# Patient Record
Sex: Male | Born: 1975 | Race: Black or African American | Hispanic: No | Marital: Single | Smoking: Current every day smoker
Health system: Southern US, Community
[De-identification: ages and names within clinical notes are randomized; demographics above are authoritative.]

## PROBLEM LIST (undated history)

## (undated) HISTORY — PX: FRACTURE SURGERY: SHX138

---

## 2010-02-05 ENCOUNTER — Emergency Department: Payer: Self-pay | Admitting: Emergency Medicine

## 2011-04-10 LAB — CBC
HGB: 13.4 g/dL (ref 13.0–18.0)
MCH: 31.5 pg (ref 26.0–34.0)
MCHC: 33.2 g/dL (ref 32.0–36.0)
MCV: 95 fL (ref 80–100)
Platelet: 248 10*3/uL (ref 150–440)
RBC: 4.26 10*6/uL — ABNORMAL LOW (ref 4.40–5.90)
WBC: 19.4 10*3/uL — ABNORMAL HIGH (ref 3.8–10.6)

## 2011-04-10 LAB — COMPREHENSIVE METABOLIC PANEL
Albumin: 3.7 g/dL (ref 3.4–5.0)
Alkaline Phosphatase: 63 U/L (ref 50–136)
Anion Gap: 8 (ref 7–16)
Bilirubin,Total: 0.7 mg/dL (ref 0.2–1.0)
Calcium, Total: 9.3 mg/dL (ref 8.5–10.1)
Chloride: 101 mmol/L (ref 98–107)
Co2: 28 mmol/L (ref 21–32)
EGFR (African American): >60
EGFR (Non-African Amer.): >60
Osmolality: 274 (ref 275–301)
SGOT(AST): 14 U/L — ABNORMAL LOW (ref 15–37)
SGPT (ALT): 15 U/L
Total Protein: 8.2 g/dL (ref 6.4–8.2)

## 2011-04-10 LAB — DIFFERENTIAL
Basophil %: 0.2 %
Eosinophil %: 0.7 %
Lymphocyte #: 2.6 10*3/uL (ref 1.0–3.6)
Monocyte #: 1.2 10*3/uL — ABNORMAL HIGH (ref 0.0–0.7)
Neutrophil #: 15.4 10*3/uL — ABNORMAL HIGH (ref 1.4–6.5)

## 2011-04-11 ENCOUNTER — Inpatient Hospital Stay: Payer: Self-pay | Admitting: Internal Medicine

## 2011-04-12 LAB — CBC WITH DIFFERENTIAL/PLATELET
Basophil #: 0.1 10*3/uL (ref 0.0–0.1)
Basophil %: 0.7 %
Eosinophil #: 0.2 10*3/uL (ref 0.0–0.7)
HCT: 37.4 % — ABNORMAL LOW (ref 40.0–52.0)
HGB: 12.4 g/dL — ABNORMAL LOW (ref 13.0–18.0)
Lymphocyte #: 2.7 10*3/uL (ref 1.0–3.6)
MCH: 31.5 pg (ref 26.0–34.0)
MCV: 95 fL (ref 80–100)
Monocyte #: 0.9 10*3/uL — ABNORMAL HIGH (ref 0.0–0.7)
Monocyte %: 9.1 %
Neutrophil #: 6.4 10*3/uL (ref 1.4–6.5)
RBC: 3.92 10*6/uL — ABNORMAL LOW (ref 4.40–5.90)
RDW: 12.6 % (ref 11.5–14.5)
WBC: 10.3 10*3/uL (ref 3.8–10.6)

## 2011-04-16 LAB — CULTURE, BLOOD (SINGLE)

## 2014-04-11 ENCOUNTER — Emergency Department: Payer: Self-pay | Admitting: Emergency Medicine

## 2014-05-30 NOTE — Consult Note (Signed)
39 year old with infection L knee area. reviewed and patient examined. area of open draining area medial knee, may communicate with prepatellar bursa, but not knee joint. No effusion knee joint itself, range of motion of knee non tender. believe that this is a largely superficial infection. Orders written for wound management/dressing changes, would continue with IV antibiotics as prescribed.  Electronic Signatures: Clare GandySmith, Orvil Faraone E (MD)  (Signed on 06-Mar-13 16:51)  Authored  Last Updated: 06-Mar-13 16:51 by Clare GandySmith, Kristyl Athens E (MD)

## 2014-05-30 NOTE — Discharge Summary (Signed)
PATIENT NAME:  Logan Robinson, Logan Robinson DATE OF BIRTH:  01-20-1976  DATE OF ADMISSION:  04/11/2011 DATE OF DISCHARGE:  04/12/2011  PRIMARY CARE PHYSICIAN: None.   PRESENTING COMPLAINT: Left knee cellulitis/pain.   DISCHARGE DIAGNOSES:  1. Left knee cellulitis/abscess.  2. Leukocytosis.   CONDITION ON DISCHARGE: Fair.   MEDICATIONS:  1. Bactrim DS 1 tablet p.o. twice a day for 10 days.  2. Tylenol 500 mg p.o. three times daily p.r.n.  3. Dressing changes per instruction.  DISCHARGE INSTRUCTIONS/FOLLOWUP: The patient is recommended to follow up in the ER or acute urgent care if needed.   LABS/STUDIES: Wound culture shows moderate staph aureus, sensitivities are follow pending.   Blood cultures are negative in 18 to 24 hours.   White cell count is 10.3. Comprehensive metabolic panel within normal limits. Hemoglobin and hematocrit 13.4 and 40.4.   CONSULTANT: Myra Rudehristopher Smith, MD - Orthopedics.  BRIEF SUMMARY OF HOSPITAL COURSE: Mr. Logan Robinson is a 39 year old African American gentleman who was brought in from the jail with complaints of ongoing left knee pain, swelling, and purulent discharge. The patient was admitted with left knee cellulitis/abscess. He had an open lesion on the left knee medially. He was initially on Keflex. That was changed to clindamycin; however, his symptoms got worse. Hence he was brought to the Emergency Room and was admitted and started on IV vancomycin. The patient's redness improved remarkably. He was also seen by Dr. Katrinka BlazingSmith who did not see any indication for incision and drainage. The patient's would culture grew Staphylococcus, lab sensitivities are pending. His antibiotics were changed to p.o. Bactrim for 10 days. His leukocytosis resolved. The patient was ambulating well in the room. Dressing supplies were given to him. He will be discharged to home and follow-up in the ER or urgent care as needed.   TIME SPENT: 40 minutes.   ____________________________ Logan HailSona A. Allena KatzPatel, MD sap:slb D: 04/12/2011 15:02:46 ET T: 04/12/2011 17:14:32 ET JOB#: [id number]  cc: Logan Morss A. Allena KatzPatel, MD, <Dictator> Logan OraSONA A Raghad Lorenz MD ELECTRONICALLY SIGNED 04/19/2011 16:04
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
## 2019-09-28 ENCOUNTER — Ambulatory Visit: Payer: Self-pay | Attending: Internal Medicine

## 2019-09-28 DIAGNOSIS — Z23 Encounter for immunization: Secondary | ICD-10-CM

## 2019-09-28 NOTE — Progress Notes (Signed)
   Covid-19 Vaccination Clinic  Name:  Logan Robinson    MRN: [id number] DOB: 06-10-1975  09/28/2019  Mr. Logan Robinson was observed post Covid-19 immunization for 15 minutes without incident. He was provided with Vaccine Information Sheet and instruction to access the V-Safe system.   Mr. Logan Robinson was instructed to call 911 with any severe reactions post vaccine: Marland Kitchen Difficulty breathing  . Swelling of face and throat  . A fast heartbeat  . A bad rash all over body  . Dizziness and weakness   Immunizations Administered    Name Date Dose VIS Date Route   Pfizer COVID-19 Vaccine 09/28/2019  2:00 PM 0.3 mL 04/01/2018 Intramuscular   Manufacturer: [employer], [employer]   Lot: [id number]   NDC: [address]-9

## 2019-10-19 ENCOUNTER — Ambulatory Visit: Payer: Self-pay

## 2021-12-26 NOTE — Congregational Nurse Program (Signed)
  Dept: [phone]   Congregational Nurse Program Note  Date of Encounter: 12/26/2021 Client to [facility] day center for assistance with new medical insurance card. RN was able to find a PCP for client, but his insurance does not become effective until 02/05/22. [facility] application was completed during visit and apt was able to be made for client on 11/28 at 3 pm. Application dropped off by Rn today. BP very high, client reports no previous history of BP, but has not had any regular medical care. RN gave education regarding symptoms to be alert for. Client voiced understanding. RN was able to make an apt for client at a new PCP after his insurance goes into effect. Appt scheduled at [facility] family medicine in [place] for 02/08/22. Client will  be able to be seen at the [facility] until then. Client very appreciative of assistance provided Past Medical History: No past medical history on file.  Encounter Details:  CNP Questionnaire - 12/26/21 1828       Questionnaire   Ask client: Do you give verbal consent for me to treat you today? Yes    Student Assistance N/A    Location Patient Served  [facility]    Visit Setting with Client Organization    Patient Status Unknown   client does have an address   Insurance Uninsured ([place] [employer]/Care Connects/Self-Pay/Medicaid Family Planning)   [place] will have BC/BS [facility] care effective 02/05/22   Insurance/Financial Assistance Referral N/A    Medication N/A   not currenlty on any medications   Medical Provider No   [facility] application completed at visit today   Screening Referrals Made N/A    Medical Referrals Made Non-[facility] PCP/Clinic   [facility] application completed and dropped off 11/21   Medical Appointment Made Non-[facility] PCP/clinic   [facility] apt 11/28 at 3;00 pm   Recently w/o PCP, now 1st time PCP visit completed due to CNs referral or appointment made N/A   1 st time apt on 11/28 at [facility]   Food N/A   has food stamps   Transportation N/A    uses the bus or has family that provides transportation   Housing/Utilities N/A    Interpersonal Safety N/A    Interventions Advocate/Support;Navigate Healthcare System;Educate;Case Management    Abnormal to Normal Screening Since Last CN Visit N/A    Screenings CN Performed Blood Pressure    Sent Client to Lab for: N/A    Did client attend any of the following based off CNs referral or appointments made? N/A   1 st apt 11/28   ED Visit Averted N/A   BP very high, client denied any sx. Educated to need to go to ED if any sx   Life-Saving Intervention Made N/A

## 2022-01-02 ENCOUNTER — Ambulatory Visit: Payer: Self-pay | Admitting: Gerontology

## 2022-01-02 ENCOUNTER — Other Ambulatory Visit: Payer: Self-pay

## 2022-01-02 ENCOUNTER — Encounter: Payer: Self-pay | Admitting: Gerontology

## 2022-01-02 VITALS — BP 195/119 | HR 72 | Wt 176.6 lb

## 2022-01-02 DIAGNOSIS — Z7689 Persons encountering health services in other specified circumstances: Secondary | ICD-10-CM | POA: Insufficient documentation

## 2022-01-02 DIAGNOSIS — R0602 Shortness of breath: Secondary | ICD-10-CM | POA: Insufficient documentation

## 2022-01-02 DIAGNOSIS — F17200 Nicotine dependence, unspecified, uncomplicated: Secondary | ICD-10-CM | POA: Insufficient documentation

## 2022-01-02 DIAGNOSIS — I10 Essential (primary) hypertension: Secondary | ICD-10-CM | POA: Insufficient documentation

## 2022-01-02 MED ORDER — LOSARTAN POTASSIUM 50 MG PO TABS
50.0000 mg | ORAL_TABLET | Freq: Every day | ORAL | 0 refills | Status: DC
  Filled 2022-01-02: qty 30, 30d supply, fill #0

## 2022-01-02 MED ORDER — BLOOD PRESSURE KIT
1.0000 | PACK | Freq: Every day | 0 refills | Status: AC
  Filled 2022-01-02: qty 1, fill #0

## 2022-01-02 MED ORDER — ALBUTEROL SULFATE HFA 108 (90 BASE) MCG/ACT IN AERS
1.0000 | INHALATION_SPRAY | Freq: Four times a day (QID) | RESPIRATORY_TRACT | 0 refills | Status: DC | PRN
  Filled 2022-01-02: qty 18, 25d supply, fill #0
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
## 2022-01-02 NOTE — Progress Notes (Deleted)
   New Patient Office Visit  Subjective    Patient ID: Riaan Toledo, male    DOB: 13-Apr-1975  Age: 46 y.o. MRN: [id number]  CC:  Chief Complaint  Patient presents with   Establish Care    Patient concerned that he may have HTN    HPI Scotty Weigelt is a 46 y/o male who has history of hypertension, presents to establish care   No outpatient encounter medications on file as of 01/02/2022.   No facility-administered encounter medications on file as of 01/02/2022.    No past medical history on file.  *** The histories are not reviewed yet. Please review them in the "History" navigator section and refresh this SmartLink.  No family history on file.  Social History   Socioeconomic History   Marital status: Single    Spouse name: Not on file   Number of children: Not on file   Years of education: Not on file   Highest education level: Not on file  Occupational History   Not on file  Tobacco Use   Smoking status: Not on file   Smokeless tobacco: Not on file  Substance and Sexual Activity   Alcohol use: Not on file   Drug use: Not on file   Sexual activity: Not on file  Other Topics Concern   Not on file  Social History Narrative   Not on file   Social Determinants of Health   Financial Resource Strain: Not on file  Food Insecurity: Not on file  Transportation Needs: Not on file  Physical Activity: Not on file  Stress: Not on file  Social Connections: Not on file  Intimate Partner Violence: Not on file    ROS      Objective    There were no vitals taken for this visit.  Physical Exam  {Labs (Optional):23779}    Assessment & Plan:   Problem List Items Addressed This Visit   None   No follow-ups on file.   Meril Dray Trellis Paganini, NP

## 2022-01-02 NOTE — Patient Instructions (Signed)

## 2022-01-03 NOTE — Congregational Nurse Program (Signed)
  Dept: [phone]   Congregational Nurse Program Note  Date of Encounter: 01/03/2022 [facility]. He was prescribed medication for his BP and albuterol for COPD. He plans to pick up his medications today. RN provided education regarding his new medications and also instructed his to speak with the pharmacist at the [facility] community pharmacy when he picked up his medications today/. He has a follow up apt at the [facility] next week, Dec. 6 for blood pressure follow up. Client very appreciative of RN assistance. Past Medical History: No past medical history on file.  Encounter Details:  CNP Questionnaire - 01/03/22 1138       Questionnaire   Ask client: Do you give verbal consent for me to treat you today? Yes    Student Assistance N/A    Location Patient Served  [facility]    Visit Setting with Client Organization    Patient Status Unknown   client does have an address   Insurance Uninsured ([employer]s/Self-Pay/Medicaid Family Planning)   [place] will have BC/BS [facility] care effective 02/05/22   Insurance/Financial Assistance Referral N/A    Medication N/A   not currenlty on any medications   Medical Provider No   [facility] application completed at visit today   Screening Referrals Made N/A    Medical Referrals Made [facility] P[facility]inic   [facility] application completed and dropped off 11/21   Medical Appointment Made Non-[facility] PCP[facility]clinic   [facility] 11/28 at 3;00 pm   Recently w/o PCP, now 1st time PCP visit completed due to CNs referral or appointment made Yes   Client did attend his [facility] last week   Food N/A   has food stamps   Transportation N/A   uses the [employer] bus or friends/family provide transportation   Housing/Utilities N/A    Interpersonal Safety N/A    Interventions Advocate/Support;Navigate Healthcare System;Educate;Case Management;Reviewed Medications    Abnormal to Normal Screening  Since Last CN Visit N/A    Screenings CN Performed Blood Pressure;Pulse Ox    Sent Client to Lab for: N/A    Did client attend any of the following based off CNs referral or appointments made? Medication Assistance;Medical   1 st apt 11/28. Client did attend his first [facility] apt and was prescribed medication for BP   ED Visit Averted N/A   BP very high, client denied any sx. Educated to need to go to ED if any sx   Life-Saving Intervention Made N/A

## 2022-01-10 ENCOUNTER — Ambulatory Visit: Payer: BLUE CROSS/BLUE SHIELD | Admitting: Gerontology

## 2022-01-10 ENCOUNTER — Other Ambulatory Visit: Payer: Self-pay

## 2022-02-14 NOTE — Congregational Nurse Program (Signed)
  Dept: [phone]   Congregational Nurse Program Note  Date of Encounter: 02/14/2022 Client to [facility] day Center for blood pressure check. He reports he did attend his new PCP at on 02/08/22 at [facility]. No medication changes. BP elevated at visit today. Client reports he has taken his BP medication today and is taking it regularly. Continued diet education provided. Client reports eating ":all the stuff I shouldn't". He does not know if he has a follow up apt. Rn to assist with this. RN requested client return to the center tomorrow for another blood pressure check. He voiced understanding and agreement. Past Medical History: No past medical history on file.  Encounter Details:  CNP Questionnaire - 02/14/22 0902       Questionnaire   Ask client: Do you give verbal consent for me to treat you today? Yes    Student Assistance N/A    Location Patient [place]    Visit Setting with Client Organization    Patient Status Unknown   client does have an address   Insurance Medicaid   BC/BS medicaid was effective 02/05/2022   Insurance/Financial Assistance Referral N/A    Medication N/A   not currenlty on any medications   Medical Provider Yes   client now a patient at [facility] in [place]. !st apt was 02/08/22   Screening Referrals Made N/A    Medical Referrals Made N/A    Medical Appointment Made N/A    Recently w/o PCP, now 1st time PCP visit completed due to CNs referral or appointment made Yes   clien now has [place] and has a new PCP, [facility] in [place]   has food stamps   Transportation N/A   uses the [employer] bus or friends/family provide transportation   Housing/Utilities N/A    Interpersonal Safety N/A    Interventions Advocate/Support;Educate;Reviewed Medications    Abnormal to Normal Screening Since Last CN Visit N/A    Screenings CN Performed Blood Pressure;Pulse Ox    Sent Client to Lab for: N/A    Did client attend any of the following based off CNs referral or  appointments made? Medical   client had his first apt at [place] his new PCP on 02/08/22   ED Visit Averted N/A   BP very high, client denied any sx. Educated to need to go to ED if any sx   Life-Saving Intervention Made N/A

## 2022-02-21 NOTE — Congregational Nurse Program (Signed)
  Dept: [phone]   Congregational Nurse Program Note  Date of Encounter: 02/21/2022 Client to [facility] day center for blood pressure check. Client has reported that he had gone to his first new patient apt at [facility] on 1/4, but he admitted that did not go. He reports taking his Norvasc 50 mg daily as directed. His blood pressure remains very elevated. He denied any headaches or dizziness. Rn was able to make an apt at [facility] for tomorrow 1/18 at 3 pm. [employer] bus pass given. Client instructed to bring his medication bottle with him to the appointment. Education given regarding s/s of stroke and importance of attending this appointment. Client voiced understanding. Past Medical History: No past medical history on file.  Encounter Details:  CNP Questionnaire - 02/21/22 0955       Questionnaire   Ask client: Do you give verbal consent for me to treat you today? Yes    Student Assistance N/A    Location Patient [place]    Visit Setting with Client Organization    Patient Status Unknown   client does have an address   Insurance Medicaid   BC/BS medicaid was effective 02/05/2022   Insurance/Financial Assistance Referral N/A    Medication N/A   not currenlty on any medications   Medical Provider Yes   client did not attend his apt on 1/4 at [place] Appointment Made [facility] P[facility]clinic   apt made aor 1/18 at 3;00 pm   Recently w/o PCP, now 1st time PCP visit completed due to CNs referral or appointment made N/A   clien now has [place] and has a new PCP, [facility] in [place]   has food stamps   Transportation Provided transportation assistance   client given bus passes for [facility] apt tomorrow 1/18   Housing/Utilities N/A    Interpersonal Safety N/A    Interventions Advocate/Support;Educate;Reviewed Medications;Navigate Healthcare System    Abnormal to Normal Screening Since Last CN Visit N/A    Screenings CN  Performed Blood Pressure;Pulse Ox    Sent Client to Lab for: N/A    Did client attend any of the following based off CNs referral or appointments made? Medical   client had his first apt at [place] his new PCP on 02/08/22   ED Visit Averted N/A   BP very high, client denied any sx. Educated to need to go to ED if any sx   Life-Saving Intervention Made N/A

## 2022-02-23 ENCOUNTER — Emergency Department: Payer: Medicaid Other

## 2022-02-23 ENCOUNTER — Inpatient Hospital Stay
Admission: EM | Admit: 2022-02-23 | Discharge: 2022-02-25 | DRG: 305 | Disposition: A | Discharge status: Home / Self Care | Payer: Medicaid Other | Source: Ambulatory Visit | Attending: Hospitalist | Admitting: Hospitalist

## 2022-02-23 ENCOUNTER — Encounter (HOSPITAL_COMMUNITY): Payer: Self-pay

## 2022-02-23 ENCOUNTER — Other Ambulatory Visit: Payer: Self-pay

## 2022-02-23 DIAGNOSIS — I160 Hypertensive urgency: Secondary | ICD-10-CM | POA: Diagnosis present

## 2022-02-23 DIAGNOSIS — R519 Headache, unspecified: Secondary | ICD-10-CM | POA: Insufficient documentation

## 2022-02-23 DIAGNOSIS — I6521 Occlusion and stenosis of right carotid artery: Secondary | ICD-10-CM | POA: Diagnosis present

## 2022-02-23 DIAGNOSIS — I161 Hypertensive emergency: Principal | ICD-10-CM | POA: Diagnosis present

## 2022-02-23 DIAGNOSIS — T465X6A Underdosing of other antihypertensive drugs, initial encounter: Secondary | ICD-10-CM | POA: Diagnosis present

## 2022-02-23 DIAGNOSIS — I10 Essential (primary) hypertension: Secondary | ICD-10-CM | POA: Diagnosis present

## 2022-02-23 DIAGNOSIS — Z79899 Other long term (current) drug therapy: Secondary | ICD-10-CM

## 2022-02-23 DIAGNOSIS — I671 Cerebral aneurysm, nonruptured: Secondary | ICD-10-CM | POA: Insufficient documentation

## 2022-02-23 DIAGNOSIS — F17210 Nicotine dependence, cigarettes, uncomplicated: Secondary | ICD-10-CM | POA: Diagnosis present

## 2022-02-23 DIAGNOSIS — Z1152 Encounter for screening for COVID-19: Secondary | ICD-10-CM

## 2022-02-23 DIAGNOSIS — Z91138 Patient's unintentional underdosing of medication regimen for other reason: Secondary | ICD-10-CM

## 2022-02-23 DIAGNOSIS — Z8249 Family history of ischemic heart disease and other diseases of the circulatory system: Secondary | ICD-10-CM

## 2022-02-23 DIAGNOSIS — I729 Aneurysm of unspecified site: Secondary | ICD-10-CM

## 2022-02-23 DIAGNOSIS — F17200 Nicotine dependence, unspecified, uncomplicated: Secondary | ICD-10-CM | POA: Diagnosis present

## 2022-02-23 LAB — BASIC METABOLIC PANEL
Anion gap: 9 (ref 5–15)
BUN: 19 mg/dL (ref 6–20)
CO2: 22 mmol/L (ref 22–32)
Calcium: 8.8 mg/dL — ABNORMAL LOW (ref 8.9–10.3)
Chloride: 104 mmol/L (ref 98–111)
Creatinine, Ser: 0.91 mg/dL (ref 0.61–1.24)
GFR, Estimated: >60 mL/min (ref >60)
Glucose, Bld: 87 mg/dL (ref 70–99)
Potassium: 3.6 mmol/L (ref 3.5–5.1)
Sodium: 135 mmol/L (ref 135–145)

## 2022-02-23 LAB — TROPONIN I (HIGH SENSITIVITY)
Troponin I (High Sensitivity): 9 ng/L (ref <18)
Troponin I (High Sensitivity): 11 ng/L (ref <18)

## 2022-02-23 LAB — RESP PANEL BY RT-PCR (RSV, FLU A&B, COVID)  RVPGX2
Influenza A by PCR: NEGATIVE
Influenza B by PCR: NEGATIVE
Resp Syncytial Virus by PCR: NEGATIVE
SARS Coronavirus 2 by RT PCR: NEGATIVE

## 2022-02-23 LAB — CBC
HCT: 43 % (ref 39.0–52.0)
Hemoglobin: 14.4 g/dL (ref 13.0–17.0)
MCH: 32.7 pg (ref 26.0–34.0)
MCHC: 33.5 g/dL (ref 30.0–36.0)
MCV: 97.5 fL (ref 80.0–100.0)
Platelets: 278 10*3/uL (ref 150–400)
RBC: 4.41 MIL/uL (ref 4.22–5.81)
RDW: 12.2 % (ref 11.5–15.5)
WBC: 13.1 10*3/uL — ABNORMAL HIGH (ref 4.0–10.5)
nRBC: 0 % (ref 0.0–0.2)

## 2022-02-23 LAB — URINALYSIS, ROUTINE W REFLEX MICROSCOPIC
Bilirubin Urine: NEGATIVE
Glucose, UA: NEGATIVE mg/dL
Hgb urine dipstick: NEGATIVE
Ketones, ur: NEGATIVE mg/dL
Leukocytes,Ua: NEGATIVE
Nitrite: NEGATIVE
Protein, ur: NEGATIVE mg/dL
Specific Gravity, Urine: 1.02 (ref 1.005–1.030)
pH: 5 (ref 5.0–8.0)

## 2022-02-23 LAB — HEPATIC FUNCTION PANEL
ALT: 19 U/L (ref 0–44)
AST: 21 U/L (ref 15–41)
Albumin: 3.8 g/dL (ref 3.5–5.0)
Alkaline Phosphatase: 49 U/L (ref 38–126)
Bilirubin, Direct: <0.1 mg/dL (ref 0.0–0.2)
Total Bilirubin: 0.7 mg/dL (ref 0.3–1.2)
Total Protein: 7.2 g/dL (ref 6.5–8.1)

## 2022-02-23 LAB — LIPASE, BLOOD: Lipase: 44 U/L (ref 11–51)

## 2022-02-23 MED ORDER — LABETALOL HCL 5 MG/ML IV SOLN
10.0000 mg | Freq: Once | INTRAVENOUS | Status: AC
  Administered 2022-02-23: 10 mg via INTRAVENOUS
  Filled 2022-02-23: qty 4

## 2022-02-23 MED ORDER — DIPHENHYDRAMINE HCL 50 MG/ML IJ SOLN
12.5000 mg | Freq: Once | INTRAMUSCULAR | Status: AC
  Administered 2022-02-23: 12.5 mg via INTRAVENOUS
  Filled 2022-02-23: qty 1

## 2022-02-23 MED ORDER — NICARDIPINE HCL IN NACL 20-0.86 MG/200ML-% IV SOLN
0.0000 mg/h | INTRAVENOUS | Status: DC
  Administered 2022-02-23: 5 mg/h via INTRAVENOUS
  Administered 2022-02-24: 11 mg/h via INTRAVENOUS
  Administered 2022-02-24: 1 mg/h via INTRAVENOUS
  Filled 2022-02-23 (×4): qty 200

## 2022-02-23 MED ORDER — METOCLOPRAMIDE HCL 5 MG/ML IJ SOLN
10.0000 mg | Freq: Once | INTRAMUSCULAR | Status: AC
  Administered 2022-02-23: 10 mg via INTRAVENOUS
  Filled 2022-02-23: qty 2

## 2022-02-23 MED ORDER — IOHEXOL 350 MG/ML SOLN
75.0000 mL | Freq: Once | INTRAVENOUS | Status: AC | PRN
  Administered 2022-02-23: 75 mL via INTRAVENOUS

## 2022-02-23 NOTE — ED Triage Notes (Addendum)
Pt to ED via POV from home. Pt c/o chest pain in the center of chest x2 wks that radiates to his back and down right arm. Pt does report nausea and vomiting. Pt also reports a headache x5 days. Pt was seen at PCP due to high BP concerns and due to CP they did an EKG yesterday that was concerning.

## 2022-02-23 NOTE — ED Notes (Signed)
Paged out [facility] for possible transfer pre Dr. Jari Pigg. Facesheet faxed, Zack to [employer].

## 2022-02-23 NOTE — ED Notes (Signed)
This RN called lab to see what delay in pt lab results are. Staff in lab advised they are short staffed and trying the best they can at this time. The MD also called down half an hour ago and got the same response.

## 2022-02-23 NOTE — ED Notes (Signed)
This RN called lab and informed them I just sent down more blood.

## 2022-02-23 NOTE — ED Notes (Signed)
Paged out [facility] for neuro surgery per Dr. Jari Pigg

## 2022-02-23 NOTE — ED Notes (Signed)
Rn to bedside.   Pt advised he has been without his BP meds 2-3 weeks. He has been having CP and headache since he was out of his meds.  Pt is currently CP free.

## 2022-02-23 NOTE — ED Notes (Signed)
This RN recollected labs and sent down again.

## 2022-02-23 NOTE — ED Provider Notes (Addendum)
[facility] Provider Note    Event Date/Time   First MD Initiated Contact with Patient 02/23/22 1823     (approximate)   History   Chest Pain   HPI  Logan Robinson is a 47 y.o. male who comes in with chest pain in the center of her chest for the past 2 weeks going down his right arm.  Patient reports has been diagnosed with hypertension.  He is supposed to be on losartan but has not been taking it.  He states that he ran out of it.  He states that he went to his primary care doctor yesterday as blood pressure was significantly elevated and they told him to go to the hospital but he declined transfer due to having commitments with his children.  He then decided to come to our emergency room.  He reports having a severe headache 8 out of 10 that has been going off and on for few weeks but does report that it worsened yesterday acutely.  He also reports having some chest discomfort over the past few weeks that is a sharp stabbing on the right side of his chest but states that he currently does not have any chest pain.  He reports no chest pain for over 24 hours.  He reports that the chest pain comes on he does get a little short of breath with it.  He denies any abdominal pain.   Physical Exam   Triage Vital Signs: ED Triage Vitals  Enc Vitals Group     BP --      Pulse --      Resp --      Temp --      Temp src --      SpO2 --      Weight 02/23/22 1818 170 lb (77.1 kg)     Height 02/23/22 1818 5\' 6"  (1.676 m)     Head Circumference --      Peak Flow --      Pain Score 02/23/22 1816 8     Pain Loc --      Pain Edu? --      Excl. in GC? --     Most recent vital signs: Vitals:   02/23/22 1830 02/23/22 1832  BP: (!) 198/147   Pulse: 88   Resp: 15   Temp:  98.1 F (36.7 C)  SpO2: 98%      General: Awake, no distress.  CV:  Good peripheral perfusion.  Resp:  Normal effort.  Abd:  No distention.  Soft and nontender Other:  Equal radial pulses.   Equal pedal pulses no numbness or tingling.   ED Results / Procedures / Treatments   Labs (all labs ordered are listed, but only abnormal results are displayed) Labs Reviewed  RESP PANEL BY RT-PCR (RSV, FLU A&B, COVID)  [id number]  BASIC METABOLIC PANEL  CBC  HEPATIC FUNCTION PANEL  LIPASE, BLOOD  TROPONIN I (HIGH SENSITIVITY)     EKG  My interpretation of EKG:  Normal sinus rate of 87 without any ST elevations but does have some T wave versions in 2 3 aVF with normal intervals  RADIOLOGY I have reviewed the xray personally and interpreted and no widened mediastinum   PROCEDURES:  Critical Care performed: Yes, see critical care procedure note(s)  Procedures   MEDICATIONS ORDERED IN ED: Medications  labetalol (NORMODYNE) injection 10 mg (has no administration in time range)  metoCLOPramide (REGLAN) injection 10 mg (has no  administration in time range)  diphenhydrAMINE (BENADRYL) injection 12.5 mg (has no administration in time range)     IMPRESSION / MDM / ASSESSMENT AND PLAN / ED COURSE  I reviewed the triage vital signs and the nursing notes.   Patient's presentation is most consistent with acute presentation with potential threat to life or bodily function.   Patient comes in with concerns for headaches and intermittent chest pain.  Patient was immediately brought back to room due to concerns with EKG with ST depressions in the inferior leads.  However upon patient coming back to the room he denies any chest pain for over 24 hours.  Do not feel this is equivalent to a STEMI.  His chief complaint is headaches.  This concerning for intercranial hemorrhage, aneurysm.  Will get CTA to further evaluate.  Will treat patient's blood pressure with labetalol and give migraine cocktail.  Labs ordered evaluate for any AKI.   Patient has continue deny any chest pain.  Did show the EKGs to Dr. Daniel Nones who thought that they were more likely just related to hypertension given  negative troponins did not feel like it was MI  7:55 PM discussed with the lab and they are stating that it will be another 30 minutes.  Explained that or even an hour and a half since labs to come back and will try to understand what the delay was and he stated that he was the only one there.   8:56 PM nurse was also called and still no labs have resulted for patient who is now been here for almost 3 hours.  I will order a CT head given my concern of this continued delay of care and the patient had an intracranial hemorrhage acutely started were aggressive treatment.  Patient does seem more comfortable resting in bed.   9:42 PM reevaluated patient and he denies any chest pain or headaches at this time.  Labs are starting to come back with a normal kidney function.  Will get the CTA.  Continues deny any chest pain.   1. No emergent large vessel occlusion. 2. 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 3. 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  I rediscussed with patient he does report that the headaches have been going on intermittently for a month now.  He reports that the headache last night was around 11pm but then he took a Aleve and the pain went away.  He reports that the headache was worse than his typical headaches but did not cause any LOC or any numbness tingling or any other concerns.  We discussed that the CT head was 93% accurate to rule out subarachnoid and the CTA also did not show any signs of leak.  Patient's headache then came back later today and he was worried about the headache which is why he came in because his brother had an aneurysm that had ruptured.  I considered the possibility of subarachnoid hemorrhage but it seems very unlikely given patient's story.  Discussed with Dr. Sena Hitch who recommended transfer for potential angiogram did not feel like LP was necessary..  I attempted to transfer to [facility] but the beds were full and unable  to do ED to ED transfer.  I talked to the neurosurgery Dr. Kathyrn Sheriff at [facility] who did not feel like patient needed angiogram and just recommended admission to the hospital team for hypertensive urgency treatment which can be done at [facility].  He stated that there would be no reason for patient to have this dealt with at [facility] or see neurosurgery at [facility] given there was no signs of rupture on CTA and CT head was negative I rediscussed with Logan Robinson who was okay with just trying to control blood pressure here given the circumstance.  Patient has remained asymptomatic after migraine cocktail.  The patient is on the cardiac monitor to evaluate for evidence of arrhythmia and/or significant heart rate changes.      FINAL CLINICAL IMPRESSION(S) / ED DIAGNOSES   Final diagnoses:  Hypertension, unspecified type  Aneurysm ([place])     Rx / DC Orders   ED Discharge Orders     None        Note:  This document was prepared using Dragon voice recognition software and may include unintentional dictation errors.   Vanessa Cary, MD 02/24/22 Benancio Deeds    Vanessa Nure[place], MD 02/24/22 Lupita Shutter

## 2022-02-24 ENCOUNTER — Encounter: Payer: Self-pay | Admitting: Internal Medicine

## 2022-02-24 DIAGNOSIS — Z1152 Encounter for screening for COVID-19: Secondary | ICD-10-CM | POA: Diagnosis not present

## 2022-02-24 DIAGNOSIS — I6521 Occlusion and stenosis of right carotid artery: Secondary | ICD-10-CM | POA: Diagnosis present

## 2022-02-24 DIAGNOSIS — F17210 Nicotine dependence, cigarettes, uncomplicated: Secondary | ICD-10-CM | POA: Diagnosis present

## 2022-02-24 DIAGNOSIS — I671 Cerebral aneurysm, nonruptured: Secondary | ICD-10-CM | POA: Diagnosis present

## 2022-02-24 DIAGNOSIS — Z79899 Other long term (current) drug therapy: Secondary | ICD-10-CM | POA: Diagnosis not present

## 2022-02-24 DIAGNOSIS — I160 Hypertensive urgency: Secondary | ICD-10-CM | POA: Diagnosis present

## 2022-02-24 DIAGNOSIS — Z8249 Family history of ischemic heart disease and other diseases of the circulatory system: Secondary | ICD-10-CM | POA: Diagnosis not present

## 2022-02-24 DIAGNOSIS — I161 Hypertensive emergency: Secondary | ICD-10-CM

## 2022-02-24 DIAGNOSIS — I10 Essential (primary) hypertension: Secondary | ICD-10-CM | POA: Diagnosis present

## 2022-02-24 DIAGNOSIS — R519 Headache, unspecified: Secondary | ICD-10-CM | POA: Insufficient documentation

## 2022-02-24 DIAGNOSIS — T465X6A Underdosing of other antihypertensive drugs, initial encounter: Secondary | ICD-10-CM | POA: Diagnosis present

## 2022-02-24 DIAGNOSIS — Z91138 Patient's unintentional underdosing of medication regimen for other reason: Secondary | ICD-10-CM | POA: Diagnosis not present

## 2022-02-24 DIAGNOSIS — I729 Aneurysm of unspecified site: Secondary | ICD-10-CM | POA: Diagnosis present

## 2022-02-24 LAB — HIV ANTIBODY (ROUTINE TESTING W REFLEX): HIV Screen 4th Generation wRfx: NONREACTIVE

## 2022-02-24 LAB — GLUCOSE, CAPILLARY: Glucose-Capillary: 131 mg/dL — ABNORMAL HIGH (ref 70–99)

## 2022-02-24 LAB — MRSA NEXT GEN BY PCR, NASAL: MRSA by PCR Next Gen: NOT DETECTED

## 2022-02-24 MED ORDER — CHLORHEXIDINE GLUCONATE CLOTH 2 % EX PADS: 6.0000 | MEDICATED_PAD | Freq: Every day | CUTANEOUS | Status: DC

## 2022-02-24 MED ORDER — LOSARTAN POTASSIUM 50 MG PO TABS
100.0000 mg | ORAL_TABLET | Freq: Every day | ORAL | Status: DC
  Administered 2022-02-24 – 2022-02-25 (×2): 100 mg via ORAL
  Filled 2022-02-24 (×2): qty 2

## 2022-02-24 MED ORDER — ONDANSETRON HCL 4 MG PO TABS: 4.0000 mg | ORAL_TABLET | Freq: Four times a day (QID) | ORAL | Status: DC | PRN

## 2022-02-24 MED ORDER — NICOTINE 21 MG/24HR TD PT24
21.0000 mg | MEDICATED_PATCH | Freq: Every day | TRANSDERMAL | Status: DC
  Administered 2022-02-24 – 2022-02-25 (×2): 21 mg via TRANSDERMAL
  Filled 2022-02-24 (×3): qty 1

## 2022-02-24 MED ORDER — HYDROCODONE-ACETAMINOPHEN 5-325 MG PO TABS: 1.0000 | ORAL_TABLET | ORAL | Status: DC | PRN

## 2022-02-24 MED ORDER — MORPHINE SULFATE (PF) 2 MG/ML IV SOLN: 2.0000 mg | INTRAVENOUS | Status: DC | PRN

## 2022-02-24 MED ORDER — ALUM & MAG HYDROXIDE-SIMETH 200-200-20 MG/5ML PO SUSP
30.0000 mL | ORAL | Status: DC | PRN
  Administered 2022-02-24: 30 mL via ORAL
  Filled 2022-02-24: qty 30

## 2022-02-24 MED ORDER — HYDRALAZINE HCL 50 MG PO TABS
100.0000 mg | ORAL_TABLET | Freq: Three times a day (TID) | ORAL | Status: DC
  Administered 2022-02-24 – 2022-02-25 (×4): 100 mg via ORAL
  Filled 2022-02-24 (×4): qty 2

## 2022-02-24 MED ORDER — ACETAMINOPHEN 650 MG RE SUPP: 650.0000 mg | Freq: Four times a day (QID) | RECTAL | Status: DC | PRN

## 2022-02-24 MED ORDER — ONDANSETRON HCL 4 MG/2ML IJ SOLN: 4.0000 mg | Freq: Four times a day (QID) | INTRAMUSCULAR | Status: DC | PRN

## 2022-02-24 MED ORDER — HYDRALAZINE HCL 50 MG PO TABS
50.0000 mg | ORAL_TABLET | Freq: Three times a day (TID) | ORAL | Status: DC
  Administered 2022-02-24: 50 mg via ORAL
  Filled 2022-02-24: qty 1

## 2022-02-24 MED ORDER — LOSARTAN POTASSIUM 50 MG PO TABS
50.0000 mg | ORAL_TABLET | Freq: Every day | ORAL | Status: DC
  Administered 2022-02-24: 50 mg via ORAL
  Filled 2022-02-24: qty 1

## 2022-02-24 MED ORDER — ENOXAPARIN SODIUM 40 MG/0.4ML IJ SOSY
40.0000 mg | PREFILLED_SYRINGE | INTRAMUSCULAR | Status: DC
  Administered 2022-02-24 – 2022-02-25 (×2): 40 mg via SUBCUTANEOUS
  Filled 2022-02-24 (×2): qty 0.4

## 2022-02-24 MED ORDER — ACETAMINOPHEN 325 MG PO TABS
650.0000 mg | ORAL_TABLET | Freq: Four times a day (QID) | ORAL | Status: DC | PRN
  Administered 2022-02-24 – 2022-02-25 (×2): 650 mg via ORAL
  Filled 2022-02-24 (×2): qty 2

## 2022-02-24 NOTE — ED Notes (Signed)
This Rn attempted to call ICU for report. Assigned RN for pt has gone to MRI with a pt. They will have him call back once he returns.

## 2022-02-24 NOTE — Assessment & Plan Note (Signed)
Nicotine patch

## 2022-02-24 NOTE — ED Notes (Signed)
Pt given a sandwich tray and something to drink.

## 2022-02-24 NOTE — Plan of Care (Signed)
  Problem: Education: Goal: Knowledge of General Education information will improve Description: Including pain rating scale, medication(s)/side effects and non-pharmacologic comfort measures Outcome: Progressing   Problem: Health Behavior/Discharge Planning: Goal: Ability to manage health-related needs will improve Outcome: Progressing   Problem: Clinical Measurements: Goal: Ability to maintain clinical measurements within normal limits will improve Outcome: Progressing Goal: Will remain free from infection Outcome: Progressing Goal: Diagnostic test results will improve Outcome: Progressing Goal: Respiratory complications will improve Outcome: Progressing Goal: Cardiovascular complication will be avoided Outcome: Progressing   Problem: Activity: Goal: Risk for activity intolerance will decrease Outcome: Progressing   Problem: Nutrition: Goal: Adequate nutrition will be maintained Outcome: Progressing   Problem: Coping: Goal: Level of anxiety will decrease Outcome: Progressing   Problem: Elimination: Goal: Will not experience complications related to bowel motility Outcome: Progressing Goal: Will not experience complications related to urinary retention Outcome: Progressing   Problem: Pain Managment: Goal: General experience of comfort will improve Outcome: Progressing   Problem: Safety: Goal: Ability to remain free from injury will improve Outcome: Progressing   Problem: Skin Integrity: Goal: Risk for impaired skin integrity will decrease Outcome: Progressing   Problem: Cardiac: Goal: Ability to maintain an adequate cardiac output will improve Outcome: Progressing   Problem: Clinical Measurements: Goal: Postoperative complications will be avoided or minimized Outcome: Progressing
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
## 2022-02-24 NOTE — Plan of Care (Signed)
Pt is progressing toward goal, VSS, Pt PO BP medication adjusted all day. Cardene weaned off for now.  Pt is stable to baseline.
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
## 2022-02-24 NOTE — Assessment & Plan Note (Signed)
BP 198/147 symptomatic for headache and chest pain Patient ran out of meds 2 weeks prior Continue Cardene drip and wean as oral meds kick in Start home losartan and can add amlodipine

## 2022-02-24 NOTE — Progress Notes (Signed)
  PROGRESS NOTE    Logan Robinson  HEN:[id number] DOB: 07/15/1975 DOA: 02/23/2022 PCP: Pcp, No  [id number]/[id number]  LOS: 0 days   Brief hospital course:   Assessment & Plan: Logan Robinson is a 47 y.o. male with medical history significant for nicotine dependence, family history of brain aneurysm in first-degree relative, hypertension, out of medication for the past 2 weeks who presents to the ED for evaluation of chest pain and headache.  He was seen by his PCP and he was advised to come to the ED for SBP in the 200s.    * Hypertensive urgency  BP 198/147 symptomatic for headache and chest pain Patient ran out of meds 2 weeks prior --started on Cardene drip  Plan: --increase home losartan to 100 mg daily --add hydralazine 100 mg q8h --taper off cardene gtt  Brain aneurysm CTA head and neck showed incidental 2 mm ICA aneurysm.  --neurosurgery consulted --No acute surgical interventions warranted. --Neurosurgery to refer him to one of the [place] cerebrovascular surgeons for possible angio/treatment.   Headache  --improved with BP control  Tobacco use disorder Nicotine patch   DVT prophylaxis: Lovenox SQ Code Status: Full code  Family Communication: sister updated at bedside today Level of care: Stepdown Dispo:   The patient is from: home Anticipated d/c is to: home Anticipated d/c date is: tomorrow   Subjective and Interval History:  Still had headache but improved.   Objective: Vitals:   02/24/22 1500 02/24/22 1523 02/24/22 1530 02/24/22 1600  BP: (!) 153/99 (!) 153/99  (!) 141/97  Pulse: 68 71 66 80  Resp: 13 16 17 20   Temp:    98.6 F (37 C)  TempSrc:    Oral  SpO2: 92% 100% 100% 98%  Weight:      Height:        Intake/Output Summary (Last 24 hours) at 02/24/2022 1735 Last data filed at 02/24/2022 1718 Gross per 24 hour  Intake 1321.39 ml  Output 3000 ml  Net -1678.61 ml   Filed Weights   02/23/22 1818  Weight: 77.1 kg    Examination:    Constitutional: NAD, AAOx3 HEENT: conjunctivae and lids normal, EOMI CV: No cyanosis.   RESP: normal respiratory effort, on RA Extremities: No effusions, edema in BLE SKIN: warm, dry Neuro: II - XII grossly intact.   Psych: Normal mood and affect.  Appropriate judgement and reason   Data Reviewed: I have personally reviewed labs and imaging studies  Time spent: 35 minutes  Enzo Bi, MD [facility] If 7PM-7AM, please contact night-coverage 02/24/2022, 5:35 PM

## 2022-02-24 NOTE — Progress Notes (Addendum)
1920 patient alert x4 able to make all needs known on room air no reports of pain noted BP elevated turned cuff on more frequent since Cardene is off  2230 patient having heart burn new orders received  2300 night meds given as ordered  02/25/22 0000 patient ambulated in room to toilet and had BM 0112 PRN tylenol given for a pinching feeling patient having on top of his head left side same pain patient had earlier today relieved with tylenol and same pain that brought patient to the hospital, patient reports pain is minimal compared to when he came into the hospital. Cardene off SPB not over 160s DPB remains elevated in the high 90s and low 100s. 0445 Morning labs unremarkable

## 2022-02-24 NOTE — Assessment & Plan Note (Signed)
Headache No evidence of acute complication on CTA head and neck Headache has resolved The importance of good BP control and regular outpatient follow-up  emphasized the patient

## 2022-02-24 NOTE — ED Notes (Signed)
Report given to [facility] of ICU.

## 2022-02-25 LAB — CBC
HCT: 43.1 % (ref 39.0–52.0)
Hemoglobin: 14.7 g/dL (ref 13.0–17.0)
MCH: 32.7 pg (ref 26.0–34.0)
MCHC: 34.1 g/dL (ref 30.0–36.0)
MCV: 96 fL (ref 80.0–100.0)
Platelets: 282 10*3/uL (ref 150–400)
RBC: 4.49 MIL/uL (ref 4.22–5.81)
RDW: 12.4 % (ref 11.5–15.5)
WBC: 13.3 10*3/uL — ABNORMAL HIGH (ref 4.0–10.5)
nRBC: 0 % (ref 0.0–0.2)

## 2022-02-25 LAB — BASIC METABOLIC PANEL
Anion gap: 9 (ref 5–15)
BUN: 14 mg/dL (ref 6–20)
CO2: 22 mmol/L (ref 22–32)
Calcium: 9.4 mg/dL (ref 8.9–10.3)
Chloride: 104 mmol/L (ref 98–111)
Creatinine, Ser: 1 mg/dL (ref 0.61–1.24)
GFR, Estimated: >60 mL/min (ref >60)
Glucose, Bld: 106 mg/dL — ABNORMAL HIGH (ref 70–99)
Potassium: 4 mmol/L (ref 3.5–5.1)
Sodium: 135 mmol/L (ref 135–145)

## 2022-02-25 LAB — MAGNESIUM: Magnesium: 2.7 mg/dL — ABNORMAL HIGH (ref 1.7–2.4)

## 2022-02-25 MED ORDER — NICOTINE 21 MG/24HR TD PT24: 21.0000 mg | MEDICATED_PATCH | Freq: Every day | TRANSDERMAL | 0 refills | Status: AC

## 2022-02-25 MED ORDER — HYDRALAZINE HCL 100 MG PO TABS: 100.0000 mg | ORAL_TABLET | Freq: Three times a day (TID) | ORAL | 2 refills | Status: AC

## 2022-02-25 MED ORDER — LOSARTAN POTASSIUM 100 MG PO TABS: 100.0000 mg | ORAL_TABLET | Freq: Every day | ORAL | 2 refills | Status: AC

## 2022-02-25 NOTE — Progress Notes (Signed)
RN explained discharge information and pt verbalized understanding. BP 140/91.Pt will receive 2pm hydralazine prior to discharge per request and pts ride is on the way to hospital to pick up the pt. IV removed. Pt is dressing himself.

## 2022-02-25 NOTE — Discharge Summary (Signed)
Physician Discharge Summary   Logan Robinson  male DOB: April 08, 1975  NIO:[id number]  PCP: Pcp, No  Admit date: 02/23/2022 Discharge date: 02/25/2022  Admitted From: home Disposition:  home CODE STATUS: Full code  Discharge Instructions     Diet - low sodium heart healthy   Complete by: As directed    Discharge instructions   Complete by: As directed    Take Losartan and hydralazine in the morning.  Take only hydralazine around noon and at night.  Please follow up with your primary care doctor for further adjustment of your blood pressure medication.  Please follow up with neurosurgery Dr. Dava Najjar in 3 weeks.   Dr. Enzo Bi [facility] Course:  For full details, please see H&P, progress notes, consult notes and ancillary notes.  Briefly,  Logan Robinson is a 47 y.o. male with medical history significant for nicotine dependence, family history of brain aneurysm in first-degree relative, hypertension, out of medication for the past 2 weeks who presented to the ED for evaluation of chest pain and headache.  He was seen by his PCP and he was advised to come to the ED for SBP in the 200s.    * Hypertensive urgency  BP 198/147 on presentation, symptomatic for headache and chest pain.  Pt received cardene gtt, while oral BP meds were titrated up. --discharged on increased home losartan to 100 mg daily --added hydralazine 100 mg q8h --f/u with PCP for further adjustment.   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  --neurosurgery consulted --No acute surgical interventions warranted. --follow up with neurosurgery Dr. Dava Najjar in 3 weeks    Headache  --improved with BP control   Tobacco use disorder Nicotine patch   Unless noted above, medications under "STOP" list are ones pt was not taking PTA.  Discharge Diagnoses:  Principal Problem:   Hypertensive emergency without congestive heart failure Active Problems:   Tobacco use  disorder   Headache   Brain aneurysm   Hypertensive urgency   30 Day Unplanned Readmission Risk Score    Flowsheet Row ED to Hosp-Admission (Current) from 02/23/2022 in [facility]/CCU  30 Day Unplanned Readmission Risk Score (%) 5.73 Filed at 02/25/2022 0801       This score is the patient's risk of an unplanned readmission within 30 days of being discharged (0 -100%). The score is based on dignosis, age, lab data, medications, orders, and past utilization.   Low:  0-14.9   Medium: 15-21.9   High: 22-29.9   Extreme: 30 and above         Discharge Instructions:  Allergies as of 02/25/2022   No Known Allergies      Medication List     STOP taking these medications    albuterol 108 (90 Base) MCG/ACT inhaler Commonly known as: VENTOLIN HFA       TAKE these medications    Blood Pressure Kit 1 kit by Does not apply route daily.   hydrALAZINE 100 MG tablet Commonly known as: APRESOLINE Take 1 tablet (100 mg total) by mouth every 8 (eight) hours.   losartan 100 MG tablet Commonly known as: COZAAR Take 1 tablet (100 mg total) by mouth daily. Start taking on: February 26, 2022 What changed:  medication strength how much to take   naproxen sodium 220 MG tablet Commonly known as: ALEVE Take 220 mg by mouth 2 (two) times daily as needed.  nicotine 21 mg/24hr patch Commonly known as: NICODERM CQ - dosed in mg/24 hours Place 1 patch (21 mg total) onto the skin daily. Start taking on: February 26, 2022         Follow-up Information     Abd-El-Barr, Jerolyn Center, MD Follow up in 3 week(s).   Specialty: Neurosurgery Contact information: [address] [place] [address] [phone]         Your primary care doctor Follow up in 1 week(s).                  No Known Allergies   The results of significant diagnostics from this hospitalization (including imaging, microbiology, ancillary and laboratory) are listed  below for reference.   Consultations:   Procedures/Studies: CT ANGIO HEAD NECK W WO CM  Result Date: 02/23/2022 CLINICAL DATA:  Headache and chest pain EXAM: CT ANGIOGRAPHY HEAD AND NECK TECHNIQUE: Multidetector CT imaging of the head and neck was performed using the standard protocol during bolus administration of intravenous contrast. Multiplanar CT image reconstructions and MIPs were obtained to evaluate the vascular anatomy. Carotid stenosis measurements (when applicable) are obtained utilizing NASCET criteria, using the distal internal carotid diameter as the denominator. RADIATION DOSE REDUCTION: This exam was performed according to the departmental dose-optimization program which includes automated exposure control, adjustment of the mA and/or kV according to patient size and/or use of iterative reconstruction technique. CONTRAST:  8mL OMNIPAQUE IOHEXOL 350 MG/ML SOLN COMPARISON:  None Available. FINDINGS: CTA NECK FINDINGS SKELETON: There is no bony spinal canal stenosis. No lytic or blastic lesion. OTHER NECK: Normal pharynx, larynx and major salivary glands. No cervical lymphadenopathy. Unremarkable thyroid gland. UPPER CHEST: No pneumothorax or pleural effusion. No nodules or masses. AORTIC ARCH: There is no calcific atherosclerosis of the aortic arch. There is no aneurysm, dissection or hemodynamically significant stenosis of the visualized portion of the aorta. Conventional 3 vessel aortic branching pattern. The visualized proximal subclavian arteries are widely patent. RIGHT CAROTID SYSTEM: Normal without aneurysm, dissection or stenosis. LEFT CAROTID SYSTEM: Normal without aneurysm, dissection or stenosis. VERTEBRAL ARTERIES: Left dominant configuration. Both origins are clearly patent. There is no dissection, occlusion or flow-limiting stenosis to the skull base (V1-V3 segments). CTA HEAD FINDINGS POSTERIOR CIRCULATION: --Vertebral arteries: Normal V4 segments. --Inferior cerebellar  arteries: Normal. --Basilar artery: Normal. --Superior cerebellar arteries: Normal. --Posterior cerebral arteries (PCA): Normal. ANTERIOR CIRCULATION: --Intracranial internal carotid arteries: There is severe stenosis of the right ICA ophthalmic segment. There is an anterior and superior projecting aneurysm arising from the clinoid segment of the right ICA that measures 2 x 2 mm (series 6, image 114). --Anterior cerebral arteries (ACA): Normal. Both A1 segments are present. Patent anterior communicating artery (a-comm). --Middle cerebral arteries (MCA): Normal. VENOUS SINUSES: As permitted by contrast timing, patent. ANATOMIC VARIANTS: None Review of the MIP images confirms the above findings. IMPRESSION: 1. No emergent large vessel occlusion. 2. 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 3. 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 Electronically Signed   By: Deatra Robinson M.D.   On: 02/23/2022 22:13   CT HEAD WO CONTRAST ( )  Result Date: 02/23/2022 CLINICAL DATA:  Altered mental status EXAM: CT HEAD WITHOUT CONTRAST TECHNIQUE: Contiguous axial images were obtained from the base of the skull through the vertex without intravenous contrast. RADIATION DOSE REDUCTION: This exam was performed according to the departmental dose-optimization program which includes automated exposure control, adjustment of the mA and/or kV  according to patient size and/or use of iterative reconstruction technique. COMPARISON:  None Available. FINDINGS: Brain: There is no mass, hemorrhage or extra-axial collection. The size and configuration of the ventricles and extra-axial CSF spaces are normal. The brain parenchyma is normal, without acute or chronic infarction. Vascular: No abnormal hyperdensity of the major intracranial arteries or dural venous sinuses. No intracranial atherosclerosis. Skull: The visualized skull base, calvarium and extracranial soft tissues are normal. Sinuses/Orbits: No  fluid levels or advanced mucosal thickening of the visualized paranasal sinuses. No mastoid or middle ear effusion. The orbits are normal. IMPRESSION: Normal head CT. Electronically Signed   By: Ulyses Jarred M.D.   On: 02/23/2022 20:58   DG Chest Port 1 View  Result Date: 02/23/2022 CLINICAL DATA:  Chest pain. EXAM: PORTABLE CHEST 1 VIEW COMPARISON:  None Available. FINDINGS: The lungs are clear without focal pneumonia, edema, pneumothorax or pleural effusion. The cardiopericardial silhouette is within normal limits for size. The visualized bony structures of the thorax are unremarkable. Telemetry leads overlie the chest. IMPRESSION: No active disease. Electronically Signed   By: Misty Stanley M.D.   On: 02/23/2022 18:45      Labs: BNP (last 3 results) No results for input(s): "BNP" in the last 8760 hours. Basic Metabolic Panel: Recent Labs  Lab 02/23/22 1819 02/25/22 0413  NA 135 135  K 3.6 4.0  CL 104 104  CO2 22 22  GLUCOSE 87 106*  BUN 19 14  CREATININE 0.91 1.00  CALCIUM 8.8* 9.4  MG  --  2.7*   Liver Function Tests: Recent Labs  Lab 02/23/22 1853  AST 21  ALT 19  ALKPHOS 49  BILITOT 0.7  PROT 7.2  ALBUMIN 3.8   Recent Labs  Lab 02/23/22 1853  LIPASE 44   No results for input(s): "AMMONIA" in the last 168 hours. CBC: Recent Labs  Lab 02/23/22 1819 02/25/22 0413  WBC 13.1* 13.3*  HGB 14.4 14.7  HCT 43.0 43.1  MCV 97.5 96.0  PLT 278 282   Cardiac Enzymes: No results for input(s): "CKTOTAL", "CKMB", "CKMBINDEX", "TROPONINI" in the last 168 hours. BNP: Invalid input(s): "POCBNP" CBG: Recent Labs  Lab 02/24/22 0327  GLUCAP 131*   D-Dimer No results for input(s): "DDIMER" in the last 72 hours. Hgb A1c No results for input(s): "HGBA1C" in the last 72 hours. Lipid Profile No results for input(s): "CHOL", "HDL", "LDLCALC", "TRIG", "CHOLHDL", "LDLDIRECT" in the last 72 hours. Thyroid function studies No results for input(s): "TSH", "T4TOTAL",  "T3FREE", "THYROIDAB" in the last 72 hours.  Invalid input(s): "FREET3" Anemia work up No results for input(s): "VITAMINB12", "FOLATE", "FERRITIN", "TIBC", "IRON", "RETICCTPCT" in the last 72 hours. Urinalysis    Component Value Date/Time   COLORURINE YELLOW (A) 02/23/2022 1845   APPEARANCEUR HAZY (A) 02/23/2022 1845   LABSPEC 1.020 02/23/2022 1845   PHURINE 5.0 02/23/2022 1845   GLUCOSEU NEGATIVE 02/23/2022 1845   HGBUR NEGATIVE 02/23/2022 1845   BILIRUBINUR NEGATIVE 02/23/2022 1845   KETONESUR NEGATIVE 02/23/2022 1845   PROTEINUR NEGATIVE 02/23/2022 1845   NITRITE NEGATIVE 02/23/2022 1845   LEUKOCYTESUR NEGATIVE 02/23/2022 1845   Sepsis Labs Recent Labs  Lab 02/23/22 1819 02/25/22 0413  WBC 13.1* 13.3*   Microbiology Recent Results (from the past 240 hour(s))  Resp panel by RT-PCR (RSV, Flu A&B, Covid) Anterior Nasal Swab     Status: None   Collection Time: 02/23/22  6:26 PM   Specimen: Anterior Nasal Swab  Result Value Ref Range Status   SARS  Coronavirus 2 by RT PCR NEGATIVE NEGATIVE Final    Comment: (NOTE) SARS-CoV-2 target nucleic acids are NOT DETECTED.  The SARS-CoV-2 RNA is generally detectable in upper respiratory specimens during the acute phase of infection. The lowest concentration of SARS-CoV-2 viral copies this assay can detect is 138 copies/mL. A negative result does not preclude SARS-Cov-2 infection and should not be used as the sole basis for treatment or other patient management decisions. A negative result may occur with  improper specimen collection/handling, submission of specimen other than nasopharyngeal swab, presence of viral mutation(s) within the areas targeted by this assay, and inadequate number of viral copies(<138 copies/mL). A negative result must be combined with clinical observations, patient history, and epidemiological information. The expected result is Negative.  Fact Sheet for Patients:   BloggerCourse.com  Fact Sheet for Healthcare Providers:  SeriousBroker.it  This test is no t yet approved or cleared by the [place] FDA and  has been authorized for detection and/or diagnosis of SARS-CoV-2 by FDA under an Emergency Use Authorization (EUA). This EUA will remain  in effect (meaning this test can be used) for the duration of the COVID-19 declaration under Section 564(b)(1) of the Act, 21 U.S.C.section 360bbb-3(b)(1), unless the authorization is terminated  or revoked sooner.       Influenza A by PCR NEGATIVE NEGATIVE Final   Influenza B by PCR NEGATIVE NEGATIVE Final    Comment: (NOTE) The Xpert Xpress SARS-CoV-2/FLU/RSV plus assay is intended as an aid in the diagnosis of influenza from Nasopharyngeal swab specimens and should not be used as a sole basis for treatment. Nasal washings and aspirates are unacceptable for Xpert Xpress SARS-CoV-2/FLU/RSV testing.  Fact Sheet for Patients: BloggerCourse.com  Fact Sheet for Healthcare Providers: SeriousBroker.it  This test is not yet approved or cleared by the [place] FDA and has been authorized for detection and/or diagnosis of SARS-CoV-2 by FDA under an Emergency Use Authorization (EUA). This EUA will remain in effect (meaning this test can be used) for the duration of the COVID-19 declaration under Section 564(b)(1) of the Act, 21 U.S.C. section 360bbb-3(b)(1), unless the authorization is terminated or revoked.     Resp Syncytial Virus by PCR NEGATIVE NEGATIVE Final    Comment: (NOTE) Fact Sheet for Patients: BloggerCourse.com  Fact Sheet for Healthcare Providers: SeriousBroker.it  This test is not yet approved or cleared by the [place] FDA and has been authorized for detection and/or diagnosis of SARS-CoV-2 by FDA under an Emergency Use  Authorization (EUA). This EUA will remain in effect (meaning this test can be used) for the duration of the COVID-19 declaration under Section 564(b)(1) of the Act, 21 U.S.C. section 360bbb-3(b)(1), unless the authorization is terminated or revoked.  Performed at [facility], [address] [place], [place] [address]   MRSA Next Gen by PCR, Nasal     Status: None   Collection Time: 02/24/22  3:29 AM   Specimen: Nasal Mucosa; Nasal Swab  Result Value Ref Range Status   MRSA by PCR Next Gen NOT DETECTED NOT DETECTED Final    Comment: (NOTE) The GeneXpert MRSA Assay (FDA approved for NASAL specimens only), is one component of a comprehensive MRSA colonization surveillance program. It is not intended to diagnose MRSA infection nor to guide or monitor treatment for MRSA infections. Test performance is not FDA approved in patients less than 48 years old. Performed at [facility], [address] [place], [place] [address]      Total time spend  on discharging this patient, including the last patient exam, discussing the hospital stay, instructions for ongoing care as it relates to all pertinent caregivers, as well as preparing the medical discharge records, prescriptions, and/or referrals as applicable, is 35 minutes.    Darlin Priestly, MD  [facility] 02/25/2022, 11:21 AM

## 2022-02-25 NOTE — Progress Notes (Signed)
Pt A&OX4. Denies pain, no headache. VSS on rm air. BP stable this shift.

## 2022-02-28 NOTE — Congregational Nurse Program (Signed)
  Dept: [phone]   Congregational Nurse Program Note  Date of Encounter: 02/27/2022 Client to [facility] day center for blood pressure check and assistance with medical appointments and medication.  Client attended his first PCP appointment last week and was sent from this appointment to the [facility] ED due to elevated BP and chest pain. Client was admitted and discharged on 1/21 with new medications and a referral to see a neurosurgeon. He also had medication changes. He is now taking Losartan and hydralazine. He was unaware that the hydralazine was every 8 hours and had only been taking it once a day. RN was able to fill a medication box as he brought his medications to the center today. RN provided education regarding the importance of blood pressure control and reinforced diet education. He reports he has a follow up appointment at his PCP on 1/25 per his report.  He has been unable to make an appointment with the [place] neurosurgeon due to his insurance. RN to assist with this and verify at his request.. Client has very low literacy and was very appreciative of RN support. Past Medical History: No past medical history on file.  Encounter Details:  CNP Questionnaire - 02/27/22 0910       Questionnaire   Ask client: Do you give verbal consent for me to treat you today? Yes    Student Assistance N/A    Location Patient [place]    Visit Setting with Client Organization    Patient Status Unknown   client does have an address   Insurance Medicaid   BC/BS medicaid was effective 02/05/2022   Insurance/Financial Assistance Referral N/A    Medication N/A   not currenlty on any medications   Medical Provider Yes    Screening Referrals Made N/A    Medical Referrals Made N/A    Medical Appointment Made Non[facility] PCP/clinic   apt made aor 1/18 at 3;00 pm   Recently w/o PCP, now 1st time PCP visit completed due to CNs referral or appointment made Yes   Client did attend his Md apt  oin 1/18   Food N/A   has food stamps   Transportation N/A    Housing/Utilities N/A    Interpersonal Safety N/A    Interventions Advocate/Support;Educate;Reviewed Medications;Navigate Healthcare System    Abnormal to Normal Screening Since Last CN Visit N/A    Screenings CN Performed Blood Pressure    Sent Client to Lab for: N/A    Did client attend any of the following based off CNs referral or appointments made? Medical   client had his first apt at [facility] his new PCP on 02/08/22   ED Visit Averted N/A   BP very high, client denied any sx. Educated to need to go to ED if any sx   Life-Saving Intervention Made N/A

## 2022-02-28 NOTE — Congregational Nurse Program (Signed)
[place] Dept: [phone]   Congregational Nurse Program Note  Date of Encounter: 02/28/2022 Client to [facility] for blood pressure monitoring and assist with understanding his discharge paperwork. RN attempted to assist client with making an appointment with Dr. Dava Najjar at [facility] neurosurgery. He will need a referral from the [facility]. Client goes to his PCP office tomorrow and will need their assistance. RN contacted client's PCP office to discuss the need for this referral, but had to leave a message. RN to write out needs for client to take to his appointment. He plans to come to the center before his appointment tomorrow. Past Medical History: No past medical history on file.  Encounter Details:  CNP Questionnaire - 02/28/22 1016       Questionnaire   Ask client: Do you give verbal consent for me to treat you today? Yes    Student Assistance N/A    Location Patient [place]    Visit Setting with Client Organization    Patient Status Unknown   client does have an address   Insurance Medicaid   BC/BS medicaid was effective 02/05/2022   Insurance/Financial Assistance Referral N/A    Medication N/A   not currenlty on any medications   Medical Provider Yes    Screening Referrals Made N/A    Medical Referrals Made N/A    Medical Appointment Made N/A   Rn atttempted to assist client with referral to [facility] neurosurgery   Recently w/o PCP, now 1st time PCP visit completed due to CNs referral or appointment made Yes   Client did attend his Md apt oin 1/18   Food N/A   has food stamps   Transportation Provided transportation assistance   [employer] bus pass given for Md apt on 1/25   Housing/Utilities N/A    Interpersonal Safety N/A    Interventions Advocate/Support;Educate;Reviewed Medications;Navigate Healthcare System;Reviewed New Diagnosis    Abnormal to Normal Screening Since Last CN Visit N/A    Screenings CN Performed Blood Pressure;Pulse Ox    Sent Client to Lab  for: N/A    Did client attend any of the following based off CNs referral or appointments made? N/A    ED Visit Averted N/A   BP very high, client denied any sx. Educated to need to go to ED if any sx   Life-Saving Intervention Made N/A

## 2022-03-20 NOTE — Congregational Nurse Program (Signed)
  Dept: [phone]   Congregational Nurse Program Note  Date of Encounter: 03/20/2022 Client to [facility] day center with request for assistance with calling in his medication refills. Medication refills called into [facility] as requested. Client to pick up today. He does have insurance coverage. He reports having gone again to his PCP and they are taking care of the neurosurgery referral that was ordered at his discharge from [facility] on 1/21. Client also has labs ordered and needed assistance with locating the [place] site he was referred to. He will have labs drawn at [facility]. Client appreciative of assistance and plans to return to clinic tomorrow for blood pressure check. Past Medical History: No past medical history on file.  Encounter Details:  CNP Questionnaire - 03/20/22 1130       Questionnaire   Ask client: Do you give verbal consent for me to treat you today? Yes    Student Assistance N/A    Location Patient [place]    Visit Setting with Client Organization    Patient Status Unknown   client does have an address   Insurance Medicaid   BC/BS medicaid was effective 02/05/2022   Insurance/Financial Assistance Referral N/A    Medication N/A   not currenlty on any medications   Medical Provider Yes    Screening Referrals Made N/A    Medical Referrals Made N/A    Medical Appointment Made N/A   Rn atttempted to assist client with referral to [facility] neurosurgery   Recently w/o PCP, now 1st time PCP visit completed due to CNs referral or appointment made N/A   Client did attend his Md apt oin 1/18   Food N/A   has food stamps   Transportation N/A    Housing/Utilities N/A    Interpersonal Safety N/A    Interventions Advocate/Support;Educate;Reviewed Medications;Navigate Healthcare System;Reviewed New Diagnosis    Abnormal to Normal Screening Since Last CN Visit N/A    Screenings CN Performed N/A    Sent Client to Lab for: N/A   client has  lab orders from his PCP   Did client attend any of the following based off CNs referral or appointments made? Medical    ED Visit Averted N/A   BP very high, client denied any sx. Educated to need to go to ED if any sx   Life-Saving Intervention Made N/A

## 2022-05-30 NOTE — Congregational Nurse Program (Signed)
  Dept: [phone]   Congregational Nurse Program Note  Date of Encounter: 05/30/2022 Client to [facility] day center for the first visit in several weeks. He is working regularly as a Materials engineer. He reports he is taking all his blood pressure medication, however his blood pressure was 168/100 today. He will call for a follow up appointment with his PCP, he was unable to wait for Rn to call as he was on a break from work. He will try to come back to the center tomorrow with his medications and med box for RN to check for him. He may also need refills, but is unsure. RN strongly encouraged client to call his Md today to get an appointment. He voiced understanding. Francesco Runner BSN, RN Past Medical History: No past medical history on file.  Encounter Details:  CNP Questionnaire - 05/30/22 1122       Questionnaire   Ask client: Do you give verbal consent for me to treat you today? Yes    Student Assistance N/A    Location Patient Served  [facility]    Visit Setting with Client Organization    Patient Status Unknown   client does have an address   Insurance Medicaid   BC/BS medicaid was effective 02/05/2022   Insurance/Financial Assistance Referral N/A    Medication N/A   client has Medicaid, gets medications from [facility] Provider Yes   [facility] [place]   Screening Referrals Made N/A    Medical Referrals Made N/A    Medical Appointment Made N/A   Client is due a follow up apt at his PCP, number given, client to call and make apt   Recently w/o PCP, now 1st time PCP visit completed due to CNs referral or appointment made N/A   Client did attend his Md apt oin 1/18   Food N/A   has food stamps   Transportation Need transportation assistance   needs intermitent assistance with transportation   Housing/Utilities N/A    Interpersonal Safety N/A    Interventions Advocate/Support;Educate;Reviewed Medications    Abnormal to Normal Screening Since Last CN Visit  N/A    Screenings CN Performed N/A    Sent Client to Lab for: N/A   client has lab orders from his PCP   Did client attend any of the following based off CNs referral or appointments made? Medical    ED Visit Averted N/A   BP very high, client denied any sx. Educated to need to go to ED if any sx   Life-Saving Intervention Made N/A

## 2022-08-16 NOTE — Congregational Nurse Program (Signed)
  Dept: [phone]   Congregational Nurse Program Note  Date of Encounter: 08/16/2022 Client to [facility] Day center with reports of his right arm "tingling and numb". He reports that this started 2 months ago. No headache, shortness of breath or chest pain. BP 158/108. He has not contacted his PCP, [facility] in [place]. He states he is taking his current BP medications daily, " I think". RN had filled a medication box in April with his meds, but client has not been back to the center to see this RN since then. He has also not seen his PCP. He is daily smoker and drinks alcohol occasionally per his report. Client stayed for lunch at the center and secured a ride to the [facility] Emergency room. [employer] bus pass given for return trip if not admitted. Client needs simple and direct continued education related to his elevated blood pressure and diet changes. Francesco Runner BSN, RN Past Medical History: No past medical history on file.  Encounter Details:  CNP Questionnaire - 08/16/22 1142       Questionnaire   Ask client: Do you give verbal consent for me to treat you today? Yes    Student Assistance N/A    Location Patient Served  [facility]    Visit Setting with Client Organization    Patient Status Unknown   client does have an address   Insurance Medicaid   BC/BS medicaid was effective 02/05/2022   Insurance/Financial Assistance Referral N/A    Medication N/A   client has Medicaid, gets medications [facility] Provider Yes   [facility] [place]   Screening Referrals Made N/A    Medical Referrals Made ED    Medical Appointment Made N/A   Client is due a follow up apt at his PCP, number given, client to call and make apt   Recently w/o PCP, now 1st time PCP visit completed due to CNs referral or appointment made N/A   Client did attend his Md apt oin 1/18   Food N/A   has food stamps   Transportation Need transportation assistance   needs intermitent assistance with  transportation   Housing/Utilities N/A    Interpersonal Safety N/A    Interventions Advocate/Support;Educate    Abnormal to Normal Screening Since Last CN Visit N/A    Screenings CN Performed Blood Pressure;Pulse Ox    Sent Client to Lab for: N/A   client has lab orders from his PCP   Did client attend any of the following based off CNs referral or appointments made? N/A    ED Visit Averted N/A   Client reports his right arm to be "tingling and numb" x 2 months. he was getting a ride to the [facility] ED. BP elevated at 158/108   Life-Saving Intervention Made N/A

## 2022-12-20 NOTE — Congregational Nurse Program (Signed)
  Dept: [phone]   Congregational Nurse Program Note  Date of Encounter: 12/19/2022 Client to [facility] day center with request for assistance in arranging a dental appointment. Client currently has BC/BS through the exchange, he does not have Medicaid. Client reports he recently "signed up " for dental insurance. RN contacted BC/BS member services, client does not have any current dental coverage. Client stated that he had received a mailing about adding dental insurance to his coverage. He did not bring the mailing with him to this visit. Client plans to return to the center tomorrow with the mailing for RN to assist with follow up and hopefully getting him a dentist apt. Client has not been to his PCP in several months. He reports he does have all of his blood pressure medication that had been prescribed. With client's permission, RN contacted his PCP office and discovered that it was closed. Client will now need a new PCP. He did not have time to have RN assist him with this today. He remains currently applied and does have a home. RN encouraged client to return to the center with his mail and also for  assistance in obtaining a new PCP. K Levander Campion Past Medical History: No past medical history on file.  Encounter Details:  Community Questionnaire - 12/19/22 1000       Questionnaire   Ask client: Do you give verbal consent for me to treat you today? Yes    Student Assistance N/A    Location Patient Served  [facility]    Encounter Setting CN site    Population Status Unknown   client does have an address   Engineer, building services or VA Insurance   BC/BS medicaid was effective 02/05/2022   Insurance/Financial Assistance Referral N/A    Medication N/A   client has Medicaid, gets medications from [facility] Provider No   [facility] in [place], this office has closed   Screening Referrals Made N/A    Medical Referrals Made N/A    Medical Appointment Completed N/A     CNP Interventions Advocate/Support;Educate    Screenings CN Performed N/A    ED Visit Averted N/A   Client reports his right arm to be "tingling and numb" x 2 months. he was getting a ride to the [facility] ED. BP elevated at 158/108   Life-Saving Intervention Made N/A

## 2023-04-25 NOTE — Congregational Nurse Program (Signed)
  Dept: [phone]   Congregational Nurse Program Note  Date of Encounter: 04/25/2023 Client to [facility] day center, nurse led clinic with request for assistance with his insurance. RN was able to contact client's BC/BS while he was present and verify he was indeed now being billed a monthly premium, which began in January of this year. Client plans to go to Social Services to apply for Medicaid. K Levander Campion Past Medical History: No past medical history on file.  Encounter Details:  Community Questionnaire - 04/25/23 1216       Questionnaire   Ask client: Do you give verbal consent for me to treat you today? Yes    Student Assistance N/A    Location Patient Served  [facility]    Encounter Setting CN site    Population Status Unknown   client does have an address   Engineer, building services or VA Insurance   BC/BS medicaid was effective 02/05/2022   Insurance/Financial Assistance Referral Medicaid    Medication N/A   client has Medicaid, gets medications from [facility] Provider No   [facility] in [place], this office has closed   Screening Referrals Made N/A    Medical Referrals Made N/A    Medical Appointment Completed N/A    CNP Interventions Advocate/Support;Educate    Screenings CN Performed N/A    ED Visit Averted N/A   Client reports his right arm to be "tingling and numb" x 2 months. he was getting a ride to the [facility] ED. BP elevated at 158/108   Life-Saving Intervention Made N/A

## 2023-07-09 ENCOUNTER — Ambulatory Visit (LOCAL_COMMUNITY_HEALTH_CENTER): Payer: Self-pay

## 2023-07-09 DIAGNOSIS — Z111 Encounter for screening for respiratory tuberculosis: Secondary | ICD-10-CM

## 2023-07-11 ENCOUNTER — Ambulatory Visit (LOCAL_COMMUNITY_HEALTH_CENTER)

## 2023-07-11 DIAGNOSIS — Z111 Encounter for screening for respiratory tuberculosis: Secondary | ICD-10-CM

## 2023-07-11 LAB — TB SKIN TEST
Induration: 0 mm
TB Skin Test: NEGATIVE

## 2023-12-31 ENCOUNTER — Ambulatory Visit: Payer: Self-pay

## 2023-12-31 ENCOUNTER — Telehealth: Admitting: Nurse Practitioner

## 2023-12-31 DIAGNOSIS — I10 Essential (primary) hypertension: Secondary | ICD-10-CM

## 2023-12-31 MED ORDER — AMLODIPINE BESYLATE 5 MG PO TABS: 5.0000 mg | ORAL_TABLET | Freq: Every day | ORAL | 0 refills | Status: AC

## 2023-12-31 NOTE — Congregational Nurse Program (Signed)
  Dept: [phone]   Congregational Nurse Program Note  Date of Encounter: 12/31/2023 Client to [facility] Comp[facility]ionate care center, nurse led clinic, for blood pressure check and  for assistance in getting reestablished with a PCP. Client has been known to have very high blood pressure and was on medication aprox 18 months ago, but has not had any in several months per his report.BP (!) 230/130 (BP Location: Right Arm, Patient Position: Sitting, Cuff Size: Normal)   Pulse 84   SpO2 96% . He denied any headaches, chest pain or dizziness, reports some shortness of breath at times. RN contacted [employer], however the first new patient appointment available was January. The [facility] scheduler was able to arrange an appointment at [facility] for 12/10 at 10:20. The nurse line recommended that client be seen at an urgent care or ER. Rn was able to arrange a virtual visit with Lauraine Kitty NP immediately. Client was prescribed amlodipine  5 mg. Per client request the prescription was to be sent [facility]. Client to pick it up this afternoon. Client was agreeable to a follow up virtual appointment next Wednesday [facility] with Lauraine Kitty NP. At this time, he will also keep the appointment at [facility] for the 10th. Client appreciative of assistance and agreeable to RN monitoring his blood pressure at each visit to the clinic. RN to continue to provide education and support as client allows. MARLA Marina BSN, RN Past Medical History: No past medical history on file.  Encounter Details:  Community Questionnaire - 12/31/23 1015       Questionnaire   Ask client: Do you give verbal consent for me to treat you today? Yes    Student Assistance N/A    Location Patient Served  [facility]    Encounter Setting CN site    Population Status Unknown   client does have an address   Insurance Medicaid   BC/BS medicaid was effective 02/05/2022    Insurance/Financial Assistance Referral N/A    Medication Have Medication Insecurities   client has Medicaid, gets medications from [facility] Provider No   [facility] in [place], this office has closed   Screening Referrals Made Annual Wellness Visit    Medical Referrals Made [facility]    Medical Appointment Completed N/A;[facility] Visit    CNP Interventions Advocate/Support;Educate;Navigate Healthcare System;Case Management    Screenings CN Performed Blood Pressure    ED Visit Averted N/A   Client reports his right arm to be tingling and numb x 2 months. he was getting a ride to the [facility] ED. BP elevated at 158/108   Life-Saving Intervention Made N/A

## 2023-12-31 NOTE — Progress Notes (Signed)
 Acute Video Visit    Virtual Visit Consent:   Logan Robinson, you are scheduled for a virtual visit with a [facility] provider today.     Just as with appointments in the office, your consent must be obtained to participate.  Your consent will be active for this visit and any virtual visit you may have with one of our providers in the next 365 days.     If you have a MyChart account, a copy of this consent can be sent to you electronically.  All virtual visits are billed to your insurance company just like a traditional visit in the office.    If the connection with a video visit is poor, the visit may have to be switched to a telephone visit.  With either a video or telephone visit, we are not always able to ensure that we have a secure connection.     I need to obtain your verbal consent now.   Are you willing to proceed with your visit today?    Logan Robinson has provided verbal consent on 12/31/2023 for a virtual visit (video or telephone).   Lauraine Kitty, FNP  Date: 12/31/2023 10:47 AM  Subjective:     Patient ID: Logan Robinson, male    DOB: Oct 13, 1975, 48 y.o.   MRN: [id number]  LILLETTE Lauraine Kitty, connected with  Logan Robinson  ([id number], 12-Nov-1975) on 12/31/23 at  2:20 PM EST by a video-enabled telemedicine application and verified that I am speaking with the correct person using two identifiers.   Location: Patient: [facility]  Provider: Virtual Visit Location Provider: Home Office CCN: Darice Essex at [employer] [facility]   I discussed the limitations of evaluation and management by telemedicine and the availability of in person appointments. The patient expressed understanding and agreed to proceed.     HPI  Logan Robinson is a 48 y.o. who identifies as a male who was assigned male at birth, and is being seen today for consultation on high blood pressure readings. He has been seeing the Congregational nurse at [facility] where he frequents for healthcare needs. He has a  long standing history of hypertension. He has not been to a PCP in over one year now. Most recent HTN medication refills were provided by the ED.   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 He was encouraged to have a follow up in 1 year for repeat CTA to evaluate for any growth of aneurysm. At that time risk of rupture was noted to be <1%  Last time the patient was on HTN medications he was on hydralazine  and losartan  with poor control. No history of kidney involvement / also most recent labs are over one year past due   Denies acute symptoms today, currently working as a pensions consultant and is anxious to get to work at time of visit.       Objective:      Physical Exam Constitutional:      General: He is not in acute distress.    Appearance: Normal appearance.  Pulmonary:     Effort: Pulmonary effort is normal.  Neurological:     Mental Status: He is alert and oriented to person, place, and time.  Psychiatric:        Mood and Affect: Mood normal.           Assessment &  Plan:   1. 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  Dispense: 90 tablet; Refill: 0   Follow Up Instructions: I discussed the assessment and treatment plan with the patient. The patient was provided an opportunity to ask questions and all were answered. The patient agreed with the plan and demonstrated an understanding of  the instructions.  A copy of instructions were sent to the patient via MyChart unless otherwise noted below.    The patient was advised to call back or seek an in-person evaluation if the symptoms worsen or if the condition fails to improve as anticipated.    Lauraine Kitty, FNP  **Disclaimer: This note may have been dictated with voice recognition software. Similar sounding words can inadvertently be transcribed and this note may contain transcription errors which may not have been corrected upon publication of note.**

## 2023-12-31 NOTE — Telephone Encounter (Signed)
 Copied from CRM #[id number]. Topic: Clinical - Red Word Triage >> Dec 31, 2023 10:23 AM Darshell M wrote: Red Word that prompted transfer to Nurse Triage: Patient will BP of 220/130   Declines triage. Will go to UC today. New pt. Appointment made.

## 2024-01-08 ENCOUNTER — Ambulatory Visit

## 2024-01-08 NOTE — Congregational Nurse Program (Signed)
  Dept: [phone]   Congregational Nurse Program Note  Date of Encounter: 01/08/2024 Client to [facility] for blood pressure check. He has a follow up virtual visit today with NP Lauraine Kitty [facility] at 3 pm. BP (!) 180/100 (BP Location: Right Arm, Patient Position: Sitting, Cuff Size: Large)   Pulse (!) 114   SpO2 95%. Client reports has been taking his Amlodipine  5 mg daily. He does plan to go to his appointment today. NP Lauraine Kitty notified of his BP reading this morning. RN to continue to follow and provide education and support at each visit client makes to the clinic. MARLA Marina BSN, RN  Past Medical History: No past medical history on file.  Encounter Details:  Community Questionnaire - 01/08/24 0845       Questionnaire   Ask client: Do you give verbal consent for me to treat you today? Yes    Student Assistance N/A    Location Patient Served  [facility]    Encounter Setting CN site    Population Status Unknown   client does have an address   Insurance Medicaid   BC/BS medicaid was effective 02/05/2022   Insurance/Financial Assistance Referral N/A    Medication Have Medication Insecurities   client has Medicaid, gets medications [facility] Provider No   [facility] [place], this office has closed   Medical Referrals Made [facility]    Medical Appointment Completed N/A;[facility] Visit    CNP Interventions Advocate/Support;Educate;Navigate Healthcare System;Case Management    Screenings CN Performed Blood Pressure    ED Visit Averted N/A   Client reports his right arm to be tingling and numb x 2 months. he was getting a ride to the [facility] ED. BP elevated at 158/108   Life-Saving Intervention Made N/A

## 2024-01-15 ENCOUNTER — Ambulatory Visit: Admitting: Family Medicine

## 2024-01-22 ENCOUNTER — Ambulatory Visit

## 2024-02-12 ENCOUNTER — Ambulatory Visit
